# Patient Record
Sex: Female | Born: 1980 | Race: White | Hispanic: No | Marital: Married | Smoking: Never smoker
Health system: Southern US, Community
[De-identification: ages and names within clinical notes are randomized; demographics above are authoritative.]

---
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
## 1998-04-15 ENCOUNTER — Ambulatory Visit (HOSPITAL_COMMUNITY): Admission: RE | Admit: 1998-04-15 | Discharge: 1998-04-15 | Payer: Self-pay | Admitting: Dentistry

## 1998-04-15 ENCOUNTER — Encounter: Payer: Self-pay | Admitting: Dentistry

## 2000-09-03 ENCOUNTER — Emergency Department (HOSPITAL_COMMUNITY): Admission: EM | Admit: 2000-09-03 | Discharge: 2000-09-03 | Payer: Self-pay | Admitting: Emergency Medicine

## 2000-09-03 ENCOUNTER — Encounter: Payer: Self-pay | Admitting: Emergency Medicine

## 2002-12-12 ENCOUNTER — Other Ambulatory Visit: Admission: RE | Admit: 2002-12-12 | Discharge: 2002-12-12 | Payer: Self-pay | Admitting: *Deleted

## 2003-06-12 ENCOUNTER — Inpatient Hospital Stay (HOSPITAL_COMMUNITY): Admission: AD | Admit: 2003-06-12 | Discharge: 2003-06-16 | Payer: Self-pay | Admitting: *Deleted

## 2003-06-17 ENCOUNTER — Encounter: Admission: RE | Admit: 2003-06-17 | Discharge: 2003-07-17 | Payer: Self-pay | Admitting: *Deleted

## 2003-07-31 ENCOUNTER — Other Ambulatory Visit: Admission: RE | Admit: 2003-07-31 | Discharge: 2003-07-31 | Payer: Self-pay | Admitting: *Deleted

## 2004-11-13 IMAGING — US US OB LIMITED
1 series · 13 of 28 positions shown · non-contrast
Comparison: none

CLINICAL DATA: History of oligohydramnios on office ultrasound.  G1 P0.  EDC 07/06/03 by LMP dating.  Assess fetal well-being.

[Series 1: unknown · 0.30mm/px · 13 of 42 slices shown]
[im 2/42]
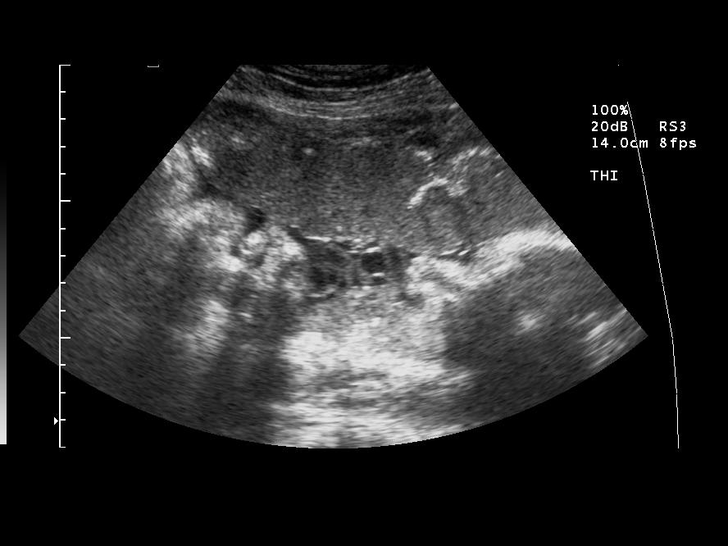
[im 5/42]
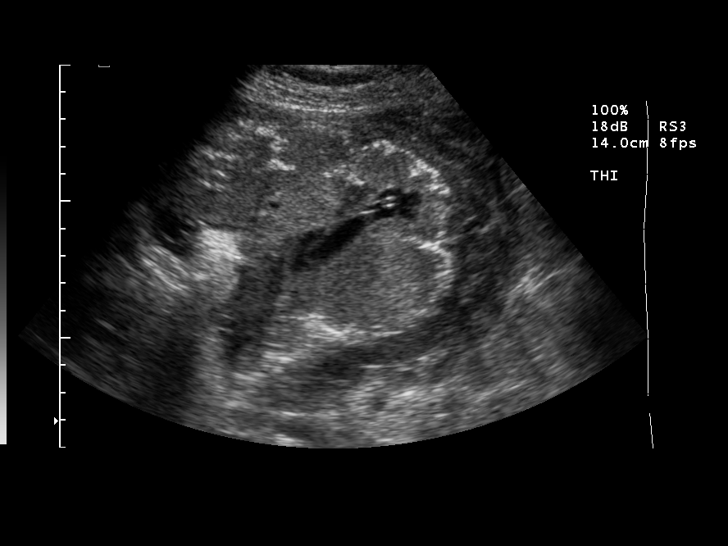
[im 8/42]
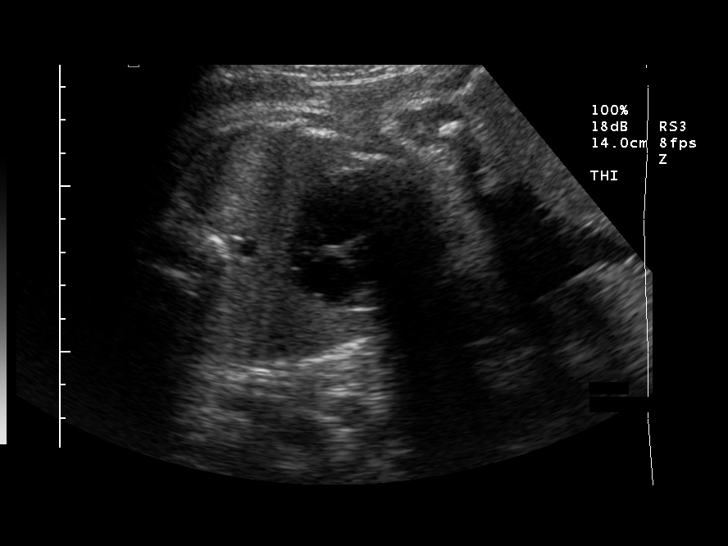
[im 11/42]
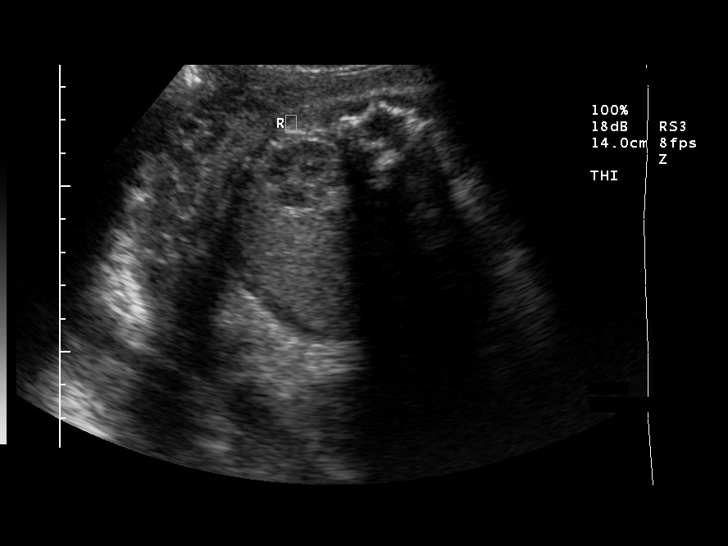
[im 14/42]
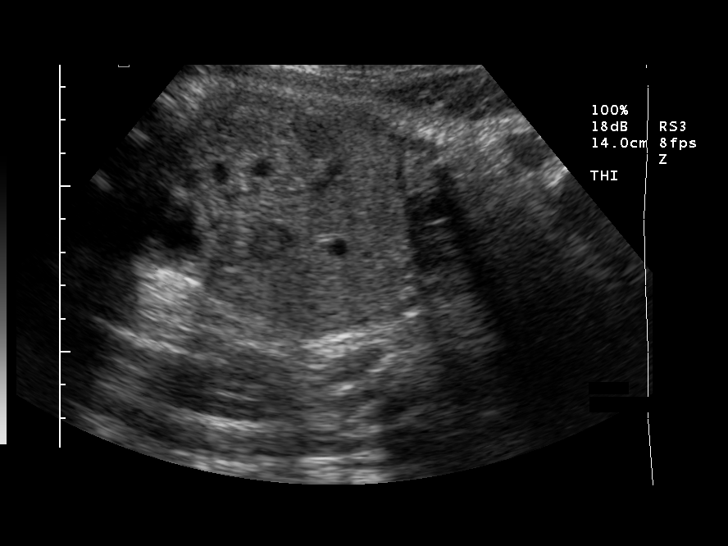
[im 17/42]
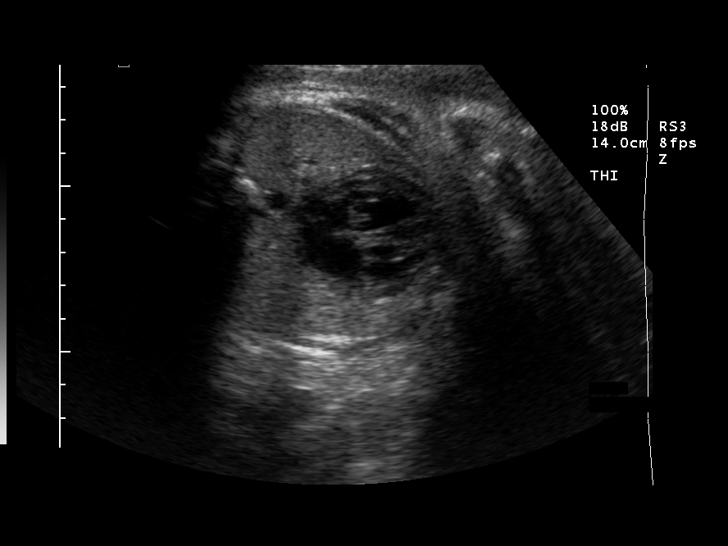
[im 22/42]
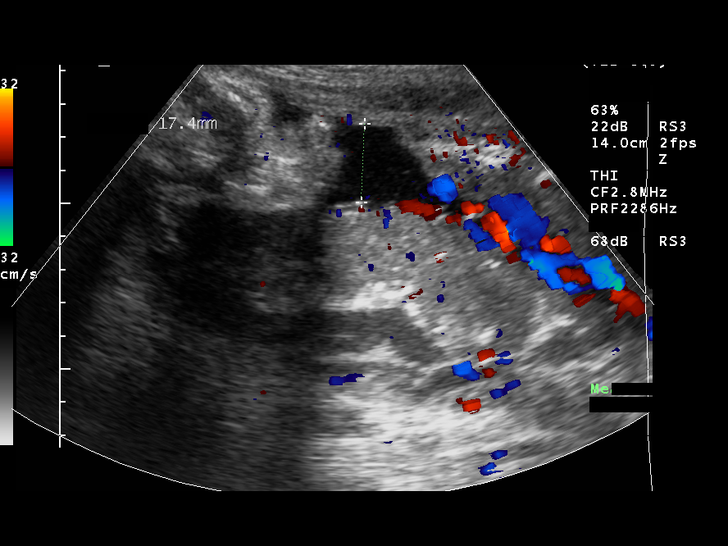
[im 25/42]
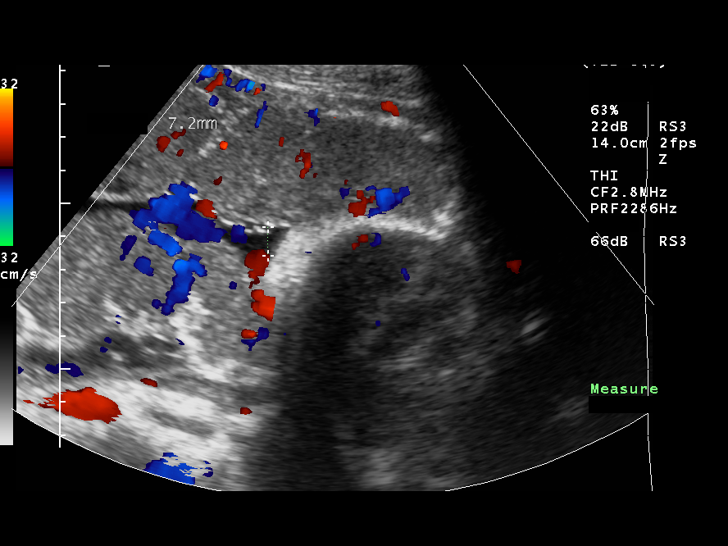
[im 28/42]
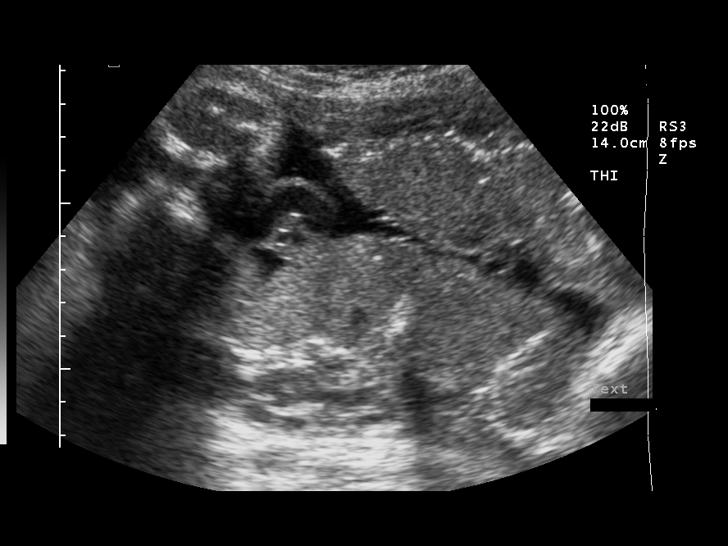
[im 31/42]
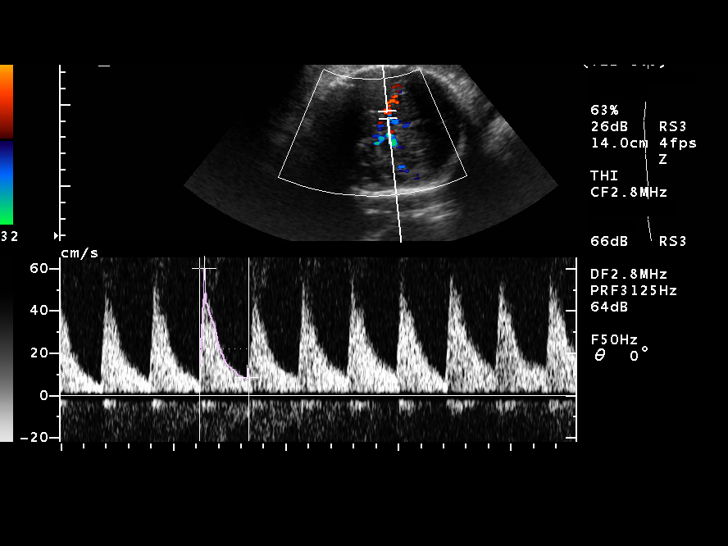
[im 34/42]
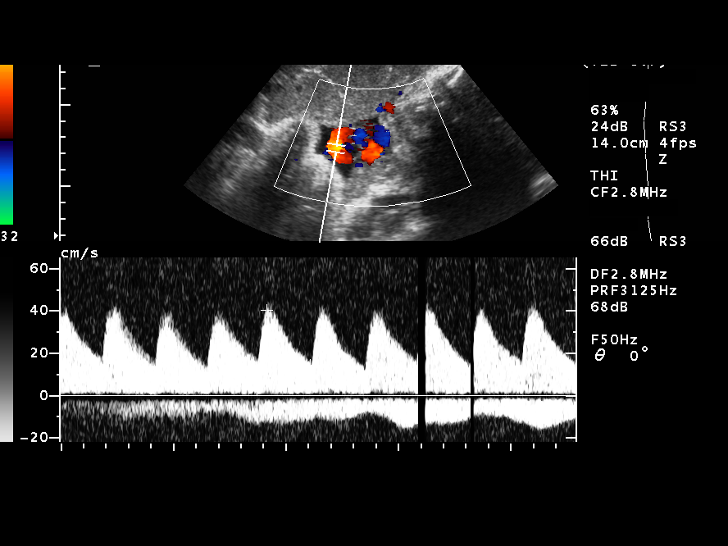
[im 37/42]
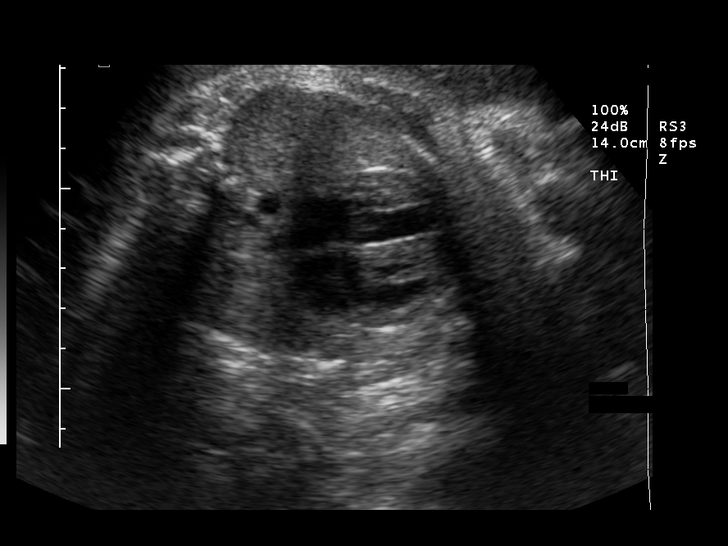
[im 40/42]
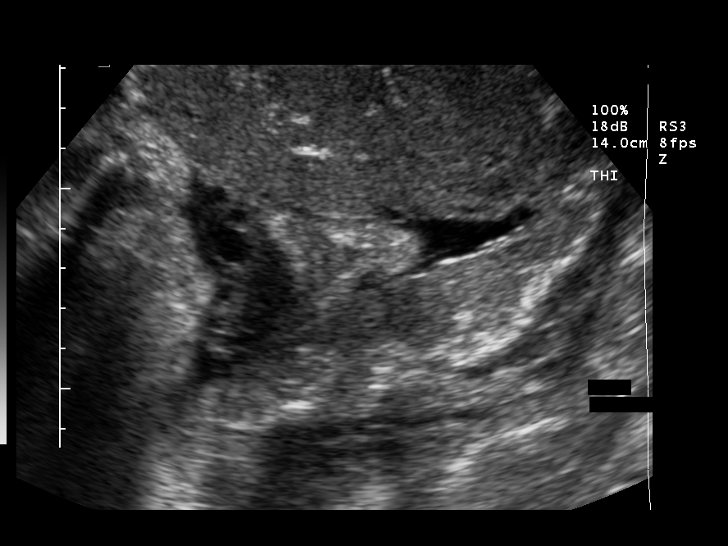

[13 of 28 positions shown; findings below may reference images not displayed]

LIMITED OBSTETRICAL ULTRASOUND

 NUMBER OF FETUSES:  1
 HEART RATE:  137
 MOVEMENT:  Yes
 BREATHING:  Yes
 PRESENTATION:  Cephalic
 PLACENTAL LOCATION:  Anterior, posterior, and left lateral
 GRADE: III
 PREVIA:  No
 AMNIOTIC FLUID (SUBJECTIVE):  Decreased
 AMNIOTIC FLUID (OBJECTIVE):  3.3 cm AFI (5th ? 95th%ile = 7.5 ? 24.4 cm for 37 weeks)

 Fetal measurements and complete anatomic evaluation were not requested on this exam.  The following fetal anatomy was visualized today:  Lateral ventricles, thalami, four chamber heart, stomach, 3 vessel cord, kidneys, bladder, diaphragm, and aortic arch.

 MATERNAL FINDINGS:
 CERVIX:  Not evaluated
IMPRESSION: Single living intrauterine fetus in cephalic presentation.  
 Decreased amniotic fluid volume with AFI 3.3 cm.
 BIOPHYSICAL PROFILE

 BPP SCORING
 MOVEMENTS:  2  Time:  15 minutes
 BREATHING:  2
 TONE:  2
 AMNIOTIC FLUID:  2
 TOTAL SCORE:  8
 IMPRESSION
 Biophysical profile score 8 of 8.
 DOPPLER ULTRASOUND:

UA:  S/D 2.36  (NL < 3.23)
 MCA:  PI  1.82  (NL > 1.15)
 IMPRESSION
 Normal obstetrical doppler.

## 2010-10-31 ENCOUNTER — Inpatient Hospital Stay (HOSPITAL_COMMUNITY): Payer: BC Managed Care – PPO

## 2010-10-31 ENCOUNTER — Inpatient Hospital Stay (HOSPITAL_COMMUNITY)
Admission: AD | Admit: 2010-10-31 | Discharge: 2010-10-31 | Disposition: A | Discharge status: Home / Self Care | Payer: BC Managed Care – PPO | Source: Ambulatory Visit | Attending: Obstetrics & Gynecology | Admitting: Obstetrics & Gynecology

## 2010-10-31 DIAGNOSIS — O00109 Unspecified tubal pregnancy without intrauterine pregnancy: Secondary | ICD-10-CM | POA: Insufficient documentation

## 2010-10-31 DIAGNOSIS — O200 Threatened abortion: Secondary | ICD-10-CM | POA: Insufficient documentation

## 2010-10-31 LAB — ABO/RH: ABO/RH(D): A POS

## 2010-11-22 ENCOUNTER — Ambulatory Visit (HOSPITAL_COMMUNITY)
Admission: AD | Admit: 2010-11-22 | Discharge: 2010-11-22 | Disposition: A | Discharge status: Home / Self Care | Payer: BC Managed Care – PPO | Source: Ambulatory Visit | Attending: Obstetrics | Admitting: Obstetrics

## 2010-11-22 ENCOUNTER — Other Ambulatory Visit: Payer: Self-pay | Admitting: Obstetrics

## 2010-11-22 DIAGNOSIS — O021 Missed abortion: Secondary | ICD-10-CM | POA: Insufficient documentation

## 2010-11-22 LAB — CBC
MCH: 30 pg (ref 26.0–34.0)
MCHC: 33.9 g/dL (ref 30.0–36.0)
MCV: 88.6 fL (ref 78.0–100.0)
Platelets: 132 10*3/uL — ABNORMAL LOW (ref 150–400)
RBC: 4.83 MIL/uL (ref 3.87–5.11)

## 2010-11-22 LAB — TYPE AND SCREEN: ABO/RH(D): A POS

## 2010-12-03 NOTE — Op Note (Signed)
  NAMEHALONA, AMSTUTZ NO.:  [id number]  MEDICAL RECORD NO.:  [id number]           PATIENT TYPE:  O  LOCATION:  [facility]                          FACILITY:  [facility]  PHYSICIAN:  Lendon Colonel, MD   DATE OF BIRTH:  Oct 14, 1980  DATE OF PROCEDURE:  11/22/2010 DATE OF DISCHARGE:  11/22/2010                              OPERATIVE REPORT   PREOPERATIVE DIAGNOSIS:  Six-week missed abortion.  POSTOPERATIVE DIAGNOSIS:  Six-week missed abortion.  PROCEDURE:  Suction dilation and curettage.  SURGEON:  Lendon Colonel, MD  ASSISTANT:  None.  ANESTHESIA:  MAC with local.  FINDINGS:  Moderate amount of POCs, hemostasis postpartum.  SPECIMENS:  POCs to Pathology.  ANTIBIOTICS:  100 mg of IV doxycycline.  ESTIMATED BLOOD LOSS:  Minimal.  COMPLICATIONS:  None.  INDICATIONS:  This is a 30 year old G84, P1 with history of recurrent pregnancy loss who was diagnosed with missed AB yesterday in the office after consideration of options including expectant management, in office suction D&C, Cytotec at home, and OR D and C.  The patient opted for the latter.  The patient has had one dose of oral doxycycline yesterday and has done 600 mcg of vaginal misoprostol 2 hours preprocedure.  PROCEDURE:  After informed consent was obtained, the patient was taken to the operating room where MAC anesthesia was initiated without difficulty.  She was prepped and draped in the normal sterile fashion in dorsal supine lithotomy position.  A bimanual examination was done to assess the size and position of the uterus.  Anteverted uterus was noted.  No catheterization was done.  A speculum was inserted into the vagina.  Single-tooth tenaculum was used to grasp the anterior lip of the cervix after first infusing the cervix with 5 mL of 0.5% Marcaine. An additional 15 mL of 0.5% Marcaine were infused at 5 and 7 o'clock in the cervical-paracervical junction.  The cervix was then dilated to a #21  Pratt dilator.  A  7-French suction curette was inserted into the uterus just past the internal os, placed on suction with 2 passes. Moderate amount of POCs was obtained.  A sharp curette was used to gently palpate the walls of the uterus.  Gritty cry was noted.  One additional suction was done just past the internal os.  No additional blood or clot was remaining.  All instruments were removed.  The tenaculum was removed. Silver nitrate was placed on the tenaculum site and the patient was awoken from general anesthesia.  The patient tolerated the procedure well.  Sponge, lap, and needle counts were correct x3 and the patient was taken to the recovery room in stable condition.     Lendon Colonel, MD     KAF/MEDQ  D:  11/22/2010  T:  11/22/2010  Job:  [id number]  Electronically Signed by Noland Fordyce MD on 12/03/2010 03:40:31 PM

## 2012-04-02 IMAGING — US US OB COMP LESS 14 WK
1 series · 14 of 28 positions shown · non-contrast
Comparison: None.

CLINICAL DATA: Pregnancy bleeding.

OBSTETRIC <14 WK US AND TRANSVAGINAL OB US
TECHNIQUE: Both transabdominal and transvaginal ultrasound
examinations were performed for complete evaluation of the
gestation as well as the maternal uterus, adnexal regions, and
pelvic cul-de-sac.  Transvaginal technique was performed to assess
early pregnancy.

[Series 1: us ob comp less 14 wks · 14 of 68 slices shown]
[im 3/68]
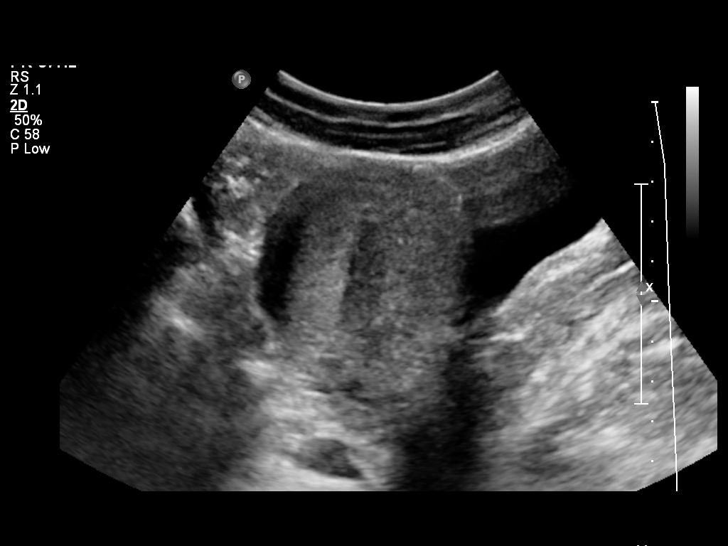
[im 8/68]
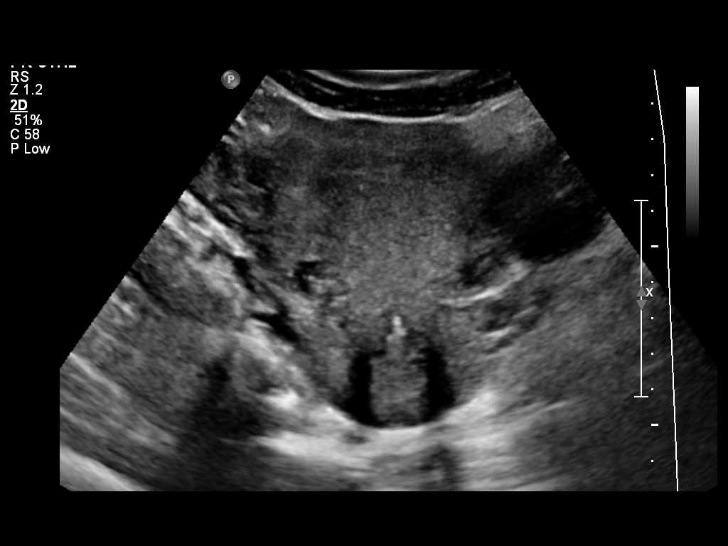
[im 13/68]
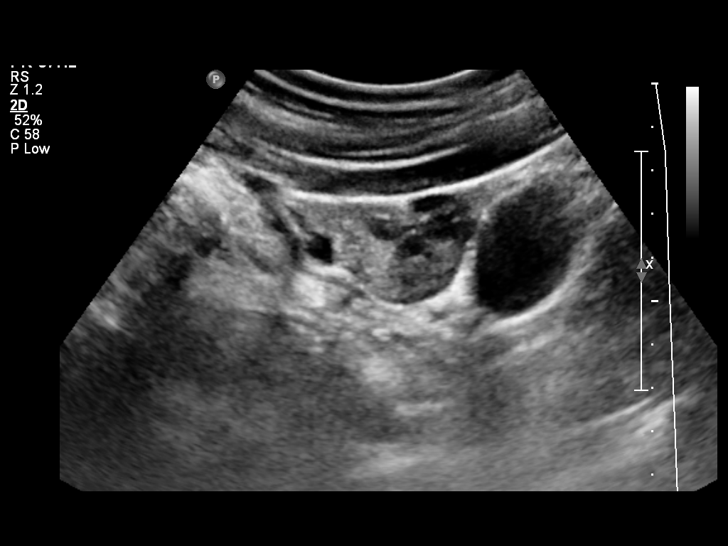
[im 18/68]
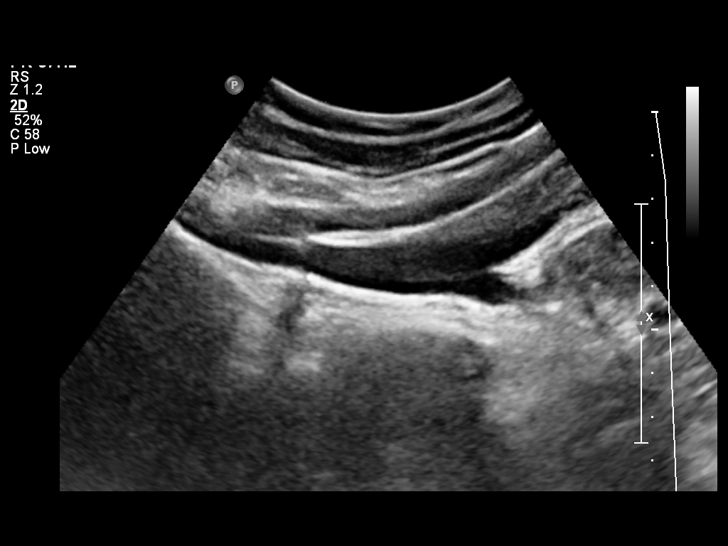
[im 23/68]
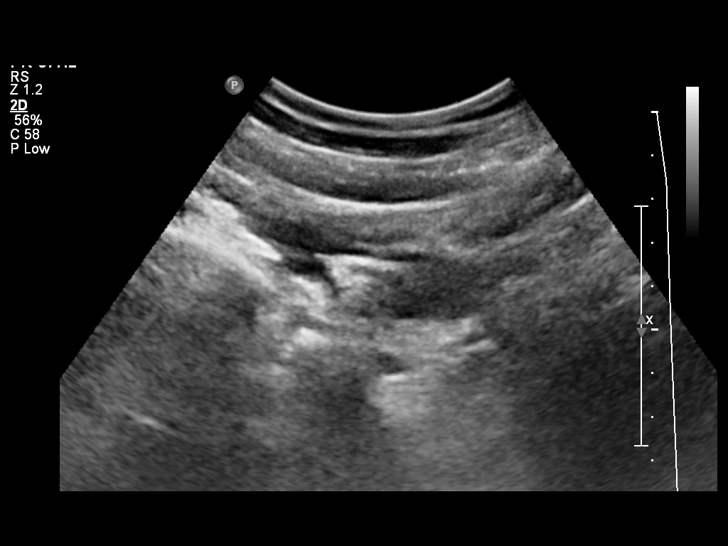
[im 28/68]
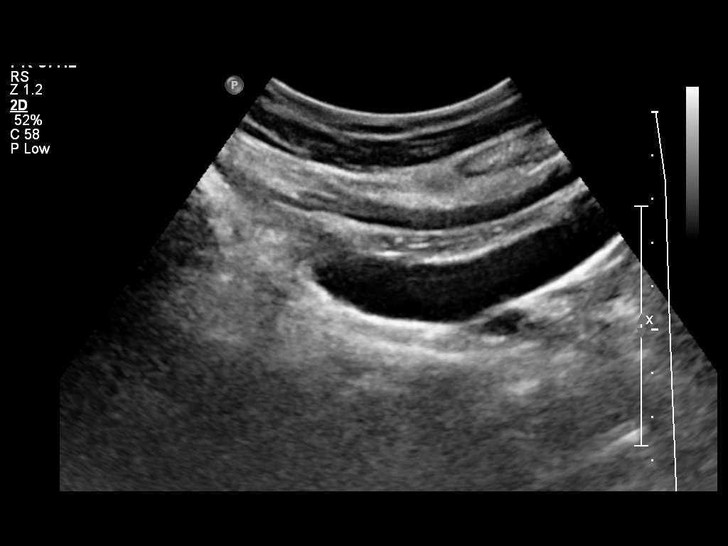
[im 33/68]
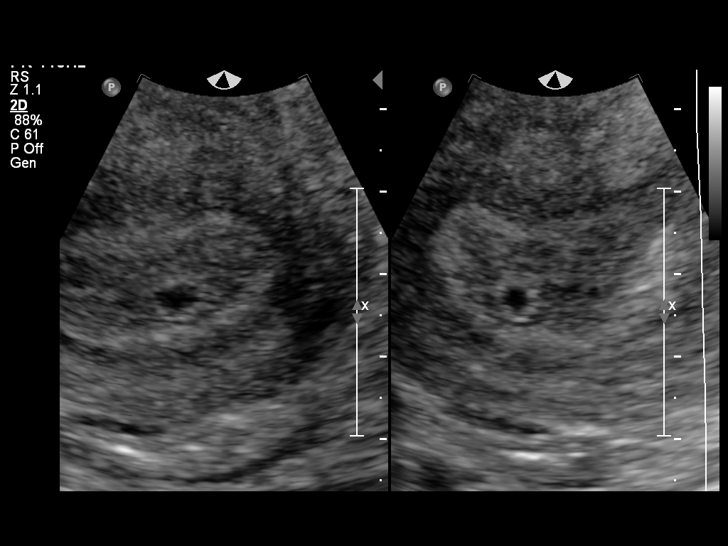
[im 38/68]
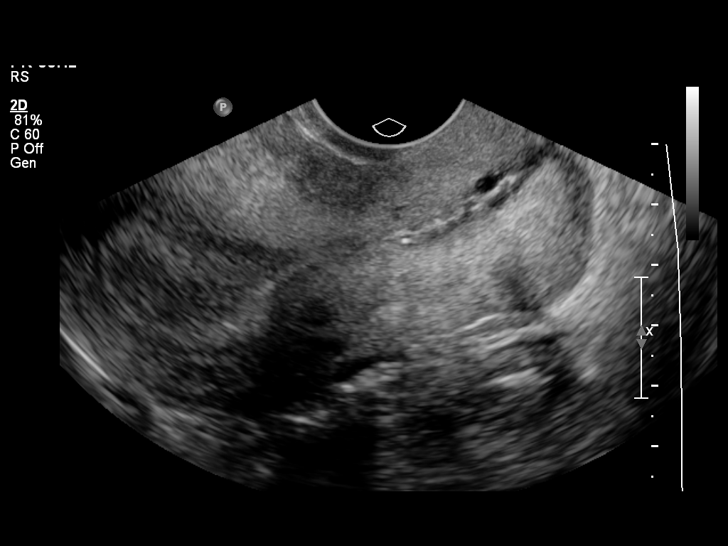
[im 43/68]
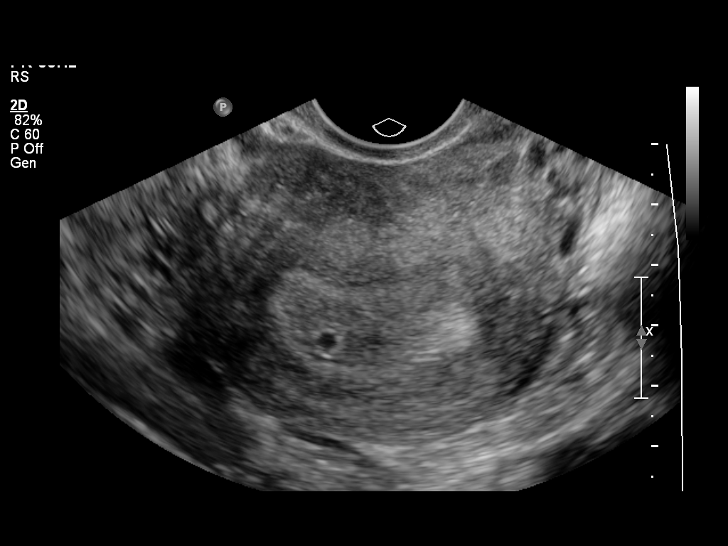
[im 48/68]
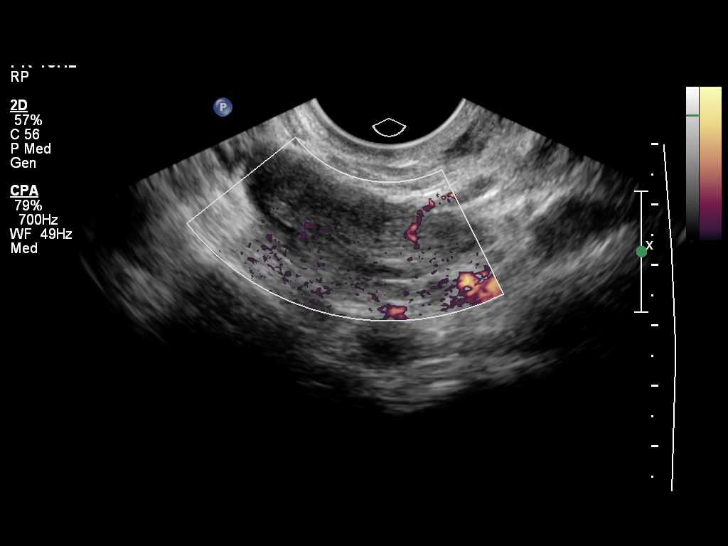
[im 53/68]
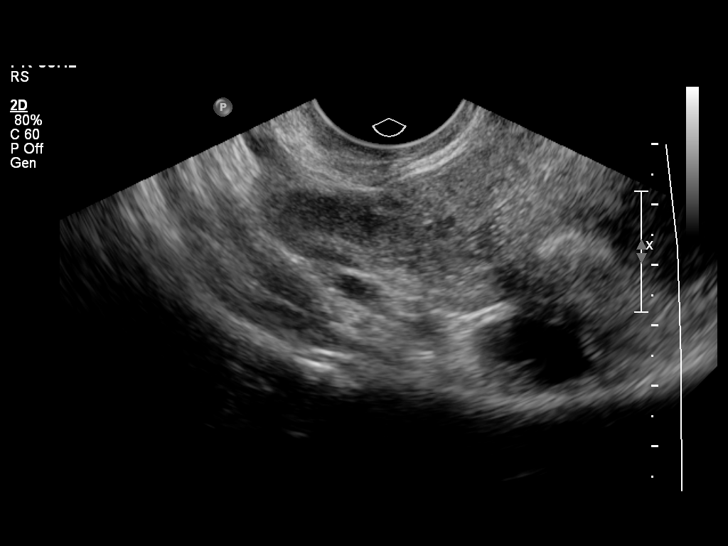
[im 58/68]
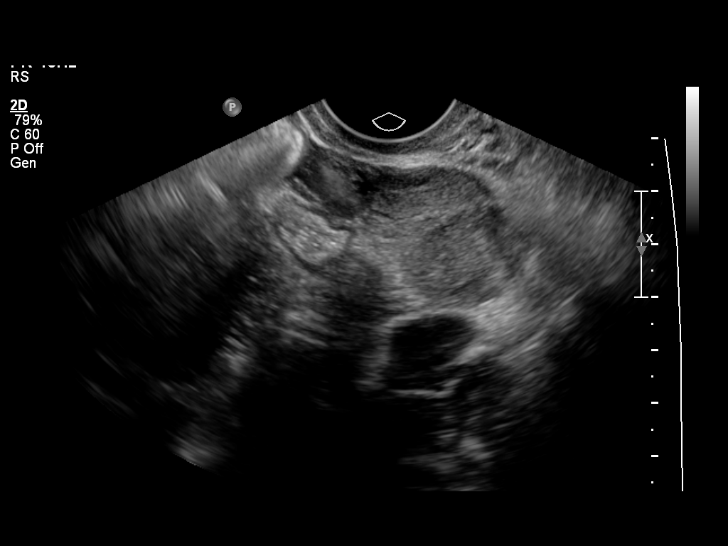
[im 63/68]
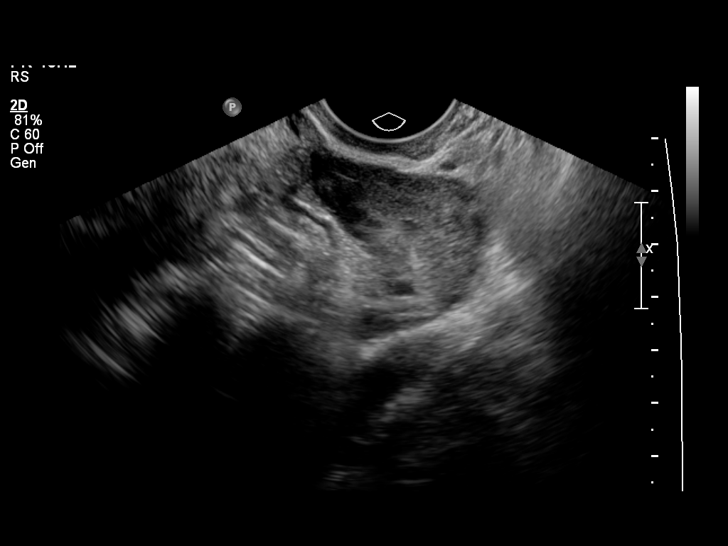
[im 68/68]
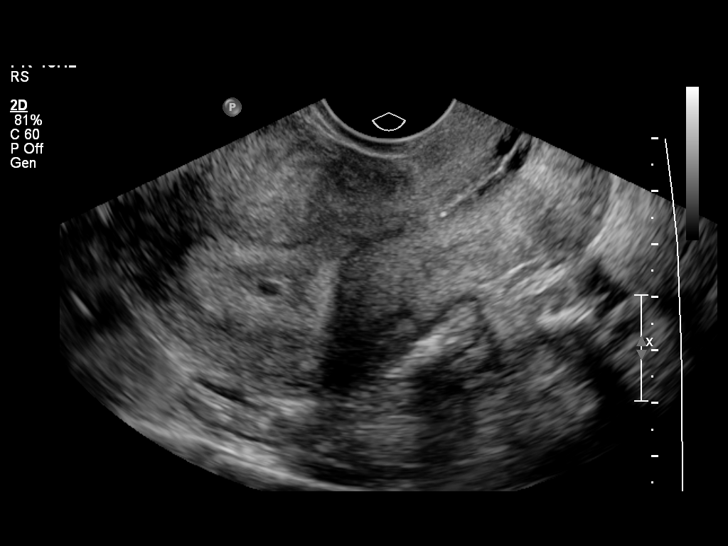

[14 of 28 positions shown; findings below may reference images not displayed]

Intrauterine gestational sac:  Eccentrically located hypoechoic
structure with probable decidual reaction, likely reflecting an
early gestational sac.
Yolk sac: Not visualized
Embryo: Not visualized

No subchorionic hemorrhage is seen.

MSD: 0.42 mm  4 w 6 d

Maternal uterus/adnexae:
Ovaries are within normal limits.  Right ovary measures 4.2 x 1.8 x
2.3 cm.  Left ovary measures 3.3 x 2.3 x 2.7 cm and is notable for
a probable corpus luteum.

Trace free fluid.
IMPRESSION: Probable early intrauterine gestational sac, as described above.
However, in the absence of a visualized yolk sac or embryo, a
definite IUP cannot be confirmed and abnormal IUP or nonvisualized
ectopic remain in the differential. Serial beta HCG is suggested,
supplemented by repeat sonography as clinically warranted.

## 2018-09-20 ENCOUNTER — Encounter (HOSPITAL_BASED_OUTPATIENT_CLINIC_OR_DEPARTMENT_OTHER): Payer: Self-pay | Admitting: Emergency Medicine

## 2018-09-20 ENCOUNTER — Emergency Department (HOSPITAL_BASED_OUTPATIENT_CLINIC_OR_DEPARTMENT_OTHER)
Admission: EM | Admit: 2018-09-20 | Discharge: 2018-09-20 | Disposition: A | Discharge status: Home / Self Care | Payer: BC Managed Care – PPO | Attending: Emergency Medicine | Admitting: Emergency Medicine

## 2018-09-20 ENCOUNTER — Other Ambulatory Visit: Payer: Self-pay

## 2018-09-20 DIAGNOSIS — R05 Cough: Secondary | ICD-10-CM | POA: Diagnosis present

## 2018-09-20 DIAGNOSIS — J111 Influenza due to unidentified influenza virus with other respiratory manifestations: Secondary | ICD-10-CM | POA: Insufficient documentation

## 2018-09-20 DIAGNOSIS — R6889 Other general symptoms and signs: Secondary | ICD-10-CM

## 2018-09-20 LAB — COMPREHENSIVE METABOLIC PANEL
ALK PHOS: 38 U/L (ref 38–126)
ALT: 13 U/L (ref 0–44)
ANION GAP: 6 (ref 5–15)
AST: 22 U/L (ref 15–41)
Albumin: 3.9 g/dL (ref 3.5–5.0)
BUN: 15 mg/dL (ref 6–20)
CHLORIDE: 104 mmol/L (ref 98–111)
CO2: 25 mmol/L (ref 22–32)
Calcium: 8.4 mg/dL — ABNORMAL LOW (ref 8.9–10.3)
Creatinine, Ser: 0.89 mg/dL (ref 0.44–1.00)
GFR calc Af Amer: >60 mL/min (ref >60)
Glucose, Bld: 109 mg/dL — ABNORMAL HIGH (ref 70–99)
Potassium: 4.2 mmol/L (ref 3.5–5.1)
SODIUM: 135 mmol/L (ref 135–145)
Total Bilirubin: 0.3 mg/dL (ref 0.3–1.2)
Total Protein: 6.9 g/dL (ref 6.5–8.1)

## 2018-09-20 LAB — CBC
HCT: 39.8 % (ref 36.0–46.0)
Hemoglobin: 12.7 g/dL (ref 12.0–15.0)
MCH: 30 pg (ref 26.0–34.0)
MCHC: 31.9 g/dL (ref 30.0–36.0)
MCV: 94.1 fL (ref 80.0–100.0)
PLATELETS: 155 10*3/uL (ref 150–400)
RBC: 4.23 MIL/uL (ref 3.87–5.11)
RDW: 13.3 % (ref 11.5–15.5)
WBC: 8.4 10*3/uL (ref 4.0–10.5)
nRBC: 0 % (ref 0.0–0.2)

## 2018-09-20 MED ORDER — ACETAMINOPHEN 325 MG PO TABS
650.0000 mg | ORAL_TABLET | Freq: Once | ORAL | Status: AC
  Administered 2018-09-20: 650 mg via ORAL
  Filled 2018-09-20: qty 2

## 2018-09-20 MED ORDER — OSELTAMIVIR PHOSPHATE 75 MG PO CAPS: 75.0000 mg | ORAL_CAPSULE | Freq: Two times a day (BID) | ORAL | 0 refills | Status: AC

## 2018-09-20 MED ORDER — SODIUM CHLORIDE 0.9 % IV BOLUS
1000.0000 mL | Freq: Once | INTRAVENOUS | Status: AC
  Administered 2018-09-20: 1000 mL via INTRAVENOUS

## 2018-09-20 NOTE — ED Provider Notes (Signed)
[facility] EMERGENCY DEPARTMENT Provider Note   CSN: [id number] Arrival date & time: 09/20/18  1138    History   Chief Complaint Chief Complaint  Patient presents with  . flu like symptoms    HPI Brittany Dunlap is a 38 y.o. female.     Patient presents with 1 day of flulike symptoms.  She has nasal congestion, nonproductive cough, fever, chills, body aches.  She says her T-max at home has been 101F.  [facility] had to have a bowel movement, became nauseous and dizzy, and her blood pressure was found to be 70/38.  This lasted about 15 minutes before self resolving after she had her bowel movement.  She has not had any shortness of breath or chest pain.  She states her blood pressure usually runs 90s over 60s.  She does not have any lightheadedness or dizziness now.  Do not get her seasonal influenza vaccination this year.     History reviewed. No pertinent past medical history.  There are no active problems to display for this patient.   History reviewed. No pertinent surgical history.   OB History   No obstetric history on file.      Home Medications    Prior to Admission medications   Not on File    Family History No family history on file.  Social History Social History   Tobacco Use  . Smoking status: Never Smoker  . Smokeless tobacco: Never Used  Substance Use Topics  . Alcohol use: Yes    Comment: occ  . Drug use: Never     Allergies   Patient has no known allergies.   Review of Systems Review of Systems  Constitutional: Positive for chills and fever.  HENT: Positive for congestion and sore throat.   Respiratory: Positive for cough. Negative for shortness of breath and wheezing.   Cardiovascular: Negative for chest pain.  Gastrointestinal: Negative for abdominal pain, diarrhea, nausea and vomiting.  Musculoskeletal: Positive for myalgias.  Skin: Negative for rash.  Neurological: Negative for  dizziness, weakness and light-headedness.     Physical Exam Updated Vital Signs BP 101/67 (BP Location: Right Arm)   Pulse 90   Temp 98 F (36.7 C) (Oral)   Resp 16   Ht 5\' 5"  (1.651 m)   Wt 56.2 kg   LMP 09/13/2018   SpO2 99%   BMI 20.63 kg/m   Physical Exam Constitutional:      General: She is not in acute distress.    Appearance: Normal appearance. She is normal weight. She is not ill-appearing, toxic-appearing or diaphoretic.  HENT:     Head: Normocephalic and atraumatic.     Right Ear: Tympanic membrane, ear canal and external ear normal.     Left Ear: Tympanic membrane, ear canal and external ear normal.     Nose: Congestion present. No rhinorrhea.     Mouth/Throat:     Mouth: Mucous membranes are dry.     Pharynx: Oropharynx is clear. No oropharyngeal exudate or posterior oropharyngeal erythema.  Eyes:     Extraocular Movements: Extraocular movements intact.     Conjunctiva/sclera: Conjunctivae normal.  Neck:     Musculoskeletal: Normal range of motion and neck supple.  Cardiovascular:     Rate and Rhythm: Normal rate and regular rhythm.     Heart sounds: Normal heart sounds. No murmur.  Pulmonary:     Effort: Pulmonary effort is normal.  Breath sounds: Normal breath sounds. No wheezing, rhonchi or rales.  Abdominal:     General: Abdomen is flat. Bowel sounds are normal.     Tenderness: There is no abdominal tenderness. There is no guarding or rebound.  Musculoskeletal: Normal range of motion.     Right lower leg: No edema.     Left lower leg: No edema.  Lymphadenopathy:     Cervical: No cervical adenopathy.  Skin:    General: Skin is warm and dry.     Capillary Refill: Capillary refill takes more than 3 seconds.     Findings: No rash.  Neurological:     General: No focal deficit present.     Mental Status: She is alert and oriented to person, place, and time.  Psychiatric:        Mood and Affect: Mood normal.        Behavior: Behavior normal.       ED Treatments / Results  Labs (all labs ordered are listed, but only abnormal results are displayed) Labs Reviewed  CBC  COMPREHENSIVE METABOLIC PANEL    EKG None  Radiology No results found.  Procedures Procedures (including critical care time)  Medications Ordered in ED Medications  sodium chloride 0.9 % bolus 1,000 mL (has no administration in time range)     Initial Impression / Assessment and Plan / ED Course  I have reviewed the triage vital signs and the nursing notes.  Pertinent labs & imaging results that were available during my care of the patient were reviewed by me and considered in my medical decision making (see chart for details).       Patient is well-appearing, mentating well, and her blood pressure here is 101/67.  She has had 1 day of flulike symptoms.  She does appear dry on exam.  Given bolus of IV fluids and labs unremarkable. VSS stable. Recommended supportive care and at home and offered tamiflu since within 48h of onset. Patient amenable. Recommended supportive care at home.  Final Clinical Impressions(s) / ED Diagnoses   Final diagnoses:  Flu-like symptoms    ED Discharge Orders    None       Leland Her, DO 09/20/18 1326    Alvira Monday, MD 09/20/18 2304

## 2018-09-20 NOTE — Discharge Instructions (Signed)
Stay well-hydrated at home

## 2018-09-20 NOTE — ED Triage Notes (Signed)
Fever,  Chills, cough, and dizziness since yesterday.  [facility] and bp was 70/38, so they sent her here by EMS.
